# Patient Record
Sex: Female | Born: 1945 | Race: White | Hispanic: No | Smoking: Former smoker
Health system: Southern US, Community
[De-identification: ages and names within clinical notes are randomized; demographics above are authoritative.]

## PROBLEM LIST (undated history)

## (undated) DIAGNOSIS — I639 Cerebral infarction, unspecified: Secondary | ICD-10-CM

## (undated) DIAGNOSIS — L659 Nonscarring hair loss, unspecified: Secondary | ICD-10-CM

## (undated) DIAGNOSIS — H9190 Unspecified hearing loss, unspecified ear: Secondary | ICD-10-CM

## (undated) DIAGNOSIS — J309 Allergic rhinitis, unspecified: Secondary | ICD-10-CM

## (undated) DIAGNOSIS — R3129 Other microscopic hematuria: Secondary | ICD-10-CM

## (undated) DIAGNOSIS — H269 Unspecified cataract: Secondary | ICD-10-CM

## (undated) DIAGNOSIS — C801 Malignant (primary) neoplasm, unspecified: Secondary | ICD-10-CM

## (undated) DIAGNOSIS — E785 Hyperlipidemia, unspecified: Secondary | ICD-10-CM

## (undated) DIAGNOSIS — F419 Anxiety disorder, unspecified: Secondary | ICD-10-CM

## (undated) DIAGNOSIS — R319 Hematuria, unspecified: Secondary | ICD-10-CM

## (undated) DIAGNOSIS — I10 Essential (primary) hypertension: Secondary | ICD-10-CM

## (undated) DIAGNOSIS — R000 Tachycardia, unspecified: Secondary | ICD-10-CM

## (undated) DIAGNOSIS — J189 Pneumonia, unspecified organism: Secondary | ICD-10-CM

## (undated) HISTORY — DX: Other microscopic hematuria: R31.29

## (undated) HISTORY — PX: COLONOSCOPY: SHX174

## (undated) HISTORY — DX: Hematuria, unspecified: R31.9

## (undated) HISTORY — PX: TONSILLECTOMY: SUR1361

## (undated) HISTORY — DX: Tachycardia, unspecified: R00.0

## (undated) HISTORY — PX: MOHS SURGERY: SUR867

## (undated) HISTORY — DX: Pneumonia, unspecified organism: J18.9

## (undated) HISTORY — DX: Essential (primary) hypertension: I10

## (undated) HISTORY — DX: Hyperlipidemia, unspecified: E78.5

## (undated) HISTORY — PX: STRABISMUS SURGERY: SHX218

## (undated) HISTORY — DX: Allergic rhinitis, unspecified: J30.9

## (undated) HISTORY — DX: Nonscarring hair loss, unspecified: L65.9

---

## 2009-07-18 ENCOUNTER — Ambulatory Visit: Payer: Self-pay | Admitting: Internal Medicine

## 2011-07-28 ENCOUNTER — Ambulatory Visit: Payer: Self-pay | Admitting: Internal Medicine

## 2014-03-13 ENCOUNTER — Emergency Department: Payer: Self-pay | Admitting: Emergency Medicine

## 2014-03-13 LAB — CBC
HCT: 41.2 % (ref 35.0–47.0)
HGB: 14.4 g/dL (ref 12.0–16.0)
MCH: 32 pg (ref 26.0–34.0)
MCHC: 34.9 g/dL (ref 32.0–36.0)
MCV: 92 fL (ref 80–100)
Platelet: 323 10*3/uL (ref 150–440)
RBC: 4.49 10*6/uL (ref 3.80–5.20)
RDW: 12.6 % (ref 11.5–14.5)
WBC: 10.9 10*3/uL (ref 3.6–11.0)

## 2014-03-13 LAB — BASIC METABOLIC PANEL
ANION GAP: 3 — AB (ref 7–16)
BUN: 17 mg/dL (ref 7–18)
CHLORIDE: 98 mmol/L (ref 98–107)
CO2: 32 mmol/L (ref 21–32)
CREATININE: 0.62 mg/dL (ref 0.60–1.30)
Calcium, Total: 9.1 mg/dL (ref 8.5–10.1)
EGFR (African American): >60
Glucose: 99 mg/dL (ref 65–99)
OSMOLALITY: 268 (ref 275–301)
Potassium: 3.2 mmol/L — ABNORMAL LOW (ref 3.5–5.1)
Sodium: 133 mmol/L — ABNORMAL LOW (ref 136–145)

## 2014-03-16 ENCOUNTER — Ambulatory Visit (INDEPENDENT_AMBULATORY_CARE_PROVIDER_SITE_OTHER): Payer: Medicare Other | Admitting: Cardiovascular Disease

## 2014-03-16 ENCOUNTER — Encounter (INDEPENDENT_AMBULATORY_CARE_PROVIDER_SITE_OTHER): Payer: Self-pay

## 2014-03-16 ENCOUNTER — Encounter: Payer: Self-pay | Admitting: Cardiovascular Disease

## 2014-03-16 VITALS — BP 148/102 | HR 97 | Ht 65.5 in | Wt 135.8 lb

## 2014-03-16 DIAGNOSIS — F4320 Adjustment disorder, unspecified: Secondary | ICD-10-CM

## 2014-03-16 DIAGNOSIS — F411 Generalized anxiety disorder: Secondary | ICD-10-CM

## 2014-03-16 DIAGNOSIS — R0602 Shortness of breath: Secondary | ICD-10-CM

## 2014-03-16 DIAGNOSIS — I10 Essential (primary) hypertension: Secondary | ICD-10-CM | POA: Insufficient documentation

## 2014-03-16 DIAGNOSIS — F419 Anxiety disorder, unspecified: Secondary | ICD-10-CM | POA: Insufficient documentation

## 2014-03-16 DIAGNOSIS — R9431 Abnormal electrocardiogram [ECG] [EKG]: Secondary | ICD-10-CM

## 2014-03-16 MED ORDER — AMLODIPINE BESYLATE 10 MG PO TABS: 10.0000 mg | ORAL_TABLET | Freq: Every day | ORAL | Status: AC

## 2014-03-16 NOTE — Assessment & Plan Note (Signed)
I known the patient for some time having taking care of her husband. She does have underlying anxiety. Loss of her husband seems to have made this worse. Tried to reassure her through this difficult time.

## 2014-03-16 NOTE — Assessment & Plan Note (Addendum)
Etiology of her symptoms is unclear though will try to exclude cardiac etiology with echo stress test. She feels that she can treadmill. If stress test is normal, would recommend routine exercise for conditioning. She does have a long prior smoking history and unable to exclude mild COPD.

## 2014-03-16 NOTE — Progress Notes (Signed)
Patient ID: Morgan Lara, female    DOB: [birth year], 68 y.o.   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   She was evaluated by her primary care physician 03/01/2014 .She was told there that she had an abnormal EKG. She was told that she might have had an old heart attack  referred here for further evaluation.  She reports that it has been a very difficult several months since her husband passed at the end of 2013/04/02. Blood pressure has been labile, heart rate labile. Difficulty sleeping. She reports that recently she developed acute onset of blurry vision. She felt like she had water in her eyes. She became very anxious. She presented to the emergency room for further evaluation. CT scan of the head was performed for vision changes and this was essentially normal. Creatinine 0.62, potassium 3.2, BUN 17, glucose 99 EKG showed normal sinus rhythm with rate 90 beats per minute, poor R-wave through the anterior precordial leads, left anterior fascicular block  She is concerned about the low potassium that was seen while she was in the emergency room 03/13/2014. Potassium was 3.2. She was given supplemental potassium by the ER doctor. Since her discharge, she has been feeling better. She is concerned about her abnormal EKG.  EKG today shows normal sinus rhythm with rate 97 beats per minute with poor R-wave progression through the anterior precordial leads, left anterior fascicular block (though unable to definitively exclude old inferior MI)   Outpatient Encounter Prescriptions as of 03/16/2014  Medication Sig  . fluticasone (FLONASE) 50 MCG/ACT nasal spray Place  2 sprays into both nostrils daily.  . pravastatin (PRAVACHOL) 40 MG tablet Take 40 mg by mouth daily.  . quinapril (ACCUPRIL) 40 MG tablet Take 40 mg by mouth at bedtime.  . zoster vaccine live, PF, (ZOSTAVAX) 84696 UNT/0.65ML injection Inject 0.65 mLs into the skin once.  .  hydrochlorothiazide (HYDRODIURIL) 25 MG tablet Take 25 mg by mouth daily.     Review of Systems  Constitutional: Negative.   HENT: Negative.   Eyes: Negative.   Respiratory: Negative.   Cardiovascular: Negative.   Gastrointestinal: Negative.   Endocrine: Negative.   Musculoskeletal: Negative.   Skin: Negative.   Allergic/Immunologic: Negative.   Neurological: Negative.   Hematological: Negative.   Psychiatric/Behavioral: The patient is nervous/anxious.   All other systems reviewed and are negative.    BP 148/102  Pulse 97  Ht 5' 5.5" (1.664 m)  Wt 135 lb 12 oz (61.576 kg)  BMI 22.24 kg/m2  Physical Exam  Nursing note and vitals reviewed. Constitutional: She is oriented to person, place, and time. She appears well-developed and well-nourished.  HENT:  Head: Normocephalic.  Nose: Nose normal.  Mouth/Throat: Oropharynx is clear and moist.  Eyes: Conjunctivae are normal. Pupils are equal, round, and reactive to light.  Neck: Normal range of motion. Neck supple. No JVD present.  Cardiovascular: Normal rate, regular rhythm, S1 normal, S2 normal, normal heart sounds and intact distal pulses.  Exam reveals no gallop and no friction rub.   No murmur heard. Pulmonary/Chest: Effort normal and breath sounds normal. No respiratory distress. She has no wheezes. She  has no rales. She exhibits no tenderness.  Abdominal: Soft. Bowel sounds are normal. She exhibits no distension. There is no tenderness.  Musculoskeletal: Normal range of motion. She exhibits no edema and no tenderness.  Lymphadenopathy:    She has no cervical adenopathy.  Neurological: She is alert and oriented to person, place, and time.  Coordination normal.  Skin: Skin is warm and dry. No rash noted. No erythema.  Psychiatric: She has a normal mood and affect. Her behavior is normal. Judgment and thought content normal.    Assessment and Plan

## 2014-03-16 NOTE — Assessment & Plan Note (Signed)
We will hold the HCTZ given her low potassium. She does not want to take a potassium supplement. We will start amlodipine 10 mg daily

## 2014-03-16 NOTE — Patient Instructions (Addendum)
You are doing well. Please hold the HCTZ Start amlodipine one a day for blood pressure  We will order an echocardiogram/echo stress echo for shortness of breath, abnormal ekg To be done on the same day  Please call us if you have new issues that need to be addressed before your next appt.

## 2014-03-16 NOTE — Assessment & Plan Note (Signed)
EKG has poor R-wave progression through the anterior precordial leads, less likely anterior MI. 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 Stress Echocardiogram has been ordered to evaluate cardiac function and rule out ischemia she also has symptoms of shortness of breath with exertion.

## 2014-03-16 NOTE — Assessment & Plan Note (Signed)
She has recently lost her husband and is having a difficult time adjusting

## 2014-03-21 ENCOUNTER — Telehealth: Payer: Self-pay

## 2014-03-21 NOTE — Telephone Encounter (Signed)
Spoke w/ pt.  She asks that I leave letter at the front desk for her to pick up tomorrow.

## 2014-03-21 NOTE — Telephone Encounter (Signed)
Pt called and states she needs a letter stating that she needs a letter stating she needs an echocardiogram. States she is filing for a pension from her husband who has passed.

## 2014-03-27 ENCOUNTER — Telehealth: Payer: Self-pay

## 2014-03-27 NOTE — Telephone Encounter (Signed)
Would try for a few weeks on a 1/2 dose before quiting We can call in HCTZ and she can take a few doses (one a day for a few days) to see if this helps her leg swelling If she is at the beach, watch out on salts and fluids Also she can have leg swelling after a car ride. Would put legs up, tight socks for a few days

## 2014-03-27 NOTE — Telephone Encounter (Signed)
Pt called and has a question regarding the new medication she is taking. Pt states she is having some "puffiness". Please call.

## 2014-03-27 NOTE — Telephone Encounter (Signed)
Spoke w/ pt.  She reports that since starting amlodipine, she has had increased swelling in her feet, ankles, and around her eyes. She took her dose this am.  Pt currently at [facility] w/ her daughter.  She has contact info for local pharmacy in case Dr. Rockey Situ would like another med called it.  Pt does not have any more HCTZ, as she ran out and has not gotten any more refills since it was d/c'd.  Pt does not have BP monitor. Please advise.  Thank you.

## 2014-03-28 ENCOUNTER — Telehealth: Payer: Self-pay

## 2014-03-28 MED ORDER — HYDROCHLOROTHIAZIDE 25 MG PO TABS: 25.0000 mg | ORAL_TABLET | Freq: Every day | ORAL | Status: DC

## 2014-03-28 NOTE — Telephone Encounter (Signed)
Left message for pt to call back  °

## 2014-03-28 NOTE — Telephone Encounter (Signed)
Pt states she needs to talk with you regarding her medication. Please call.

## 2014-03-28 NOTE — Telephone Encounter (Signed)
Spoke w/ pt.  She reports that her swelling has decreased a bit and is not as bad as yesterday, but her legs still feel a bit tight. She is agreeable to trying 1/2 amlodipine for a few weeks and will call to let me know how she is doing.  Pt does not want to take HCTZ right now, as she is at the beach and would like so see if the swelling will decrease w/o this.  Discussed the reason for swelling after a car ride.  She states that she will wear tight socks and try to elevate her legs as much as possible today. Pt to call tomorrow if she changes her mind.

## 2014-03-28 NOTE — Telephone Encounter (Signed)
Spoke w/ pt. She reports that she is still having swelling that worsened a few hours after taking 1/2 of her amlodipine. She asks that we send in an rx for HCTZ to pharmacy in [place] for her to pick up while she's at the beach.  She states that she is going to the zoo tomorrow and that she does not want to take the amlodipine before doing that much walking.  Advised her that if she would like to hold the amlodipine and continue her HCTZ until she is back at home.  She is agreeable to this and will try to obtain a BP monitor in order to keep track of this.  Pt to call w/ further questions or concerns.

## 2014-03-31 ENCOUNTER — Other Ambulatory Visit: Payer: Medicare Other

## 2014-04-06 ENCOUNTER — Telehealth: Payer: Self-pay

## 2014-04-06 NOTE — Telephone Encounter (Signed)
Pt called to confirm her appt for stress echo and asked for instructions before the test.  Reviewed procedure w/ pt.   Pt is very anxious about test, as she is concerned that something will happen to her during the test, she will not be able to wear a shirt and is concerned about her privacy. Reassured pt and addressed each of her concerns.  She is appreciative and will keep appt for stress echo.

## 2014-04-07 ENCOUNTER — Other Ambulatory Visit: Payer: Medicare Other

## 2014-04-21 ENCOUNTER — Other Ambulatory Visit: Payer: Medicare Other

## 2014-07-06 ENCOUNTER — Other Ambulatory Visit: Payer: Medicare Other

## 2014-07-21 ENCOUNTER — Other Ambulatory Visit: Payer: Medicare Other

## 2014-08-09 ENCOUNTER — Telehealth: Payer: Self-pay

## 2014-08-09 NOTE — Telephone Encounter (Signed)
Pt called and wants to some advise. States she has been scheduled for several stress echos, and we cancelled, and then she had cancelled, not sure if she should re-schedule this before she sees Dr. Rockey Situ. States she has had a lot of congestion, has called her PCP and cannot see her until next wed. Not sure if this is heart related or not. Please call.

## 2014-08-09 NOTE — Telephone Encounter (Signed)
Would probably hold off on any testing until her congestion is better. Would see her primary care first for possible allergies or cause of her chest congestion. We can certainly move her appointment up if needed

## 2014-08-09 NOTE — Telephone Encounter (Signed)
Spoke w/ pt.  She reports that she has quite a bit of congestion, states that anything she coughs up is clear.  States that her husband "coughed stuff up" and she is concerned that it is r/t her heart. Reports that her sx worsen when she has been outside for a while and she is unsure if they are r/t allergies.  States that her anxiety "sometimes gets the best of her".  She would like to know if Dr. Rockey Situ would like for her to have her stress echo before her appt on 09/12/14. Reports that she has quite a bit of anxiety regarding this and has not had any more episodes of SOB. Please advise.  Thank you.

## 2014-08-10 NOTE — Telephone Encounter (Signed)
Spoke w/ pt.  Advised her of Dr. Donivan Scull recommendation.  She verbalizes understanding and will keep her appt w/ Dr. Rockey Situ on 09/12/14. Asked her to call back w/ any questions or concerns.

## 2014-09-12 ENCOUNTER — Ambulatory Visit: Payer: Medicare Other | Admitting: Cardiovascular Disease

## 2014-09-13 ENCOUNTER — Encounter: Payer: Self-pay | Admitting: Cardiovascular Disease

## 2014-09-13 ENCOUNTER — Ambulatory Visit (INDEPENDENT_AMBULATORY_CARE_PROVIDER_SITE_OTHER): Payer: Medicare Other | Admitting: Cardiovascular Disease

## 2014-09-13 VITALS — BP 132/85 | HR 79 | Ht 65.0 in | Wt 137.2 lb

## 2014-09-13 DIAGNOSIS — R0602 Shortness of breath: Secondary | ICD-10-CM

## 2014-09-13 DIAGNOSIS — F411 Generalized anxiety disorder: Secondary | ICD-10-CM

## 2014-09-13 DIAGNOSIS — I10 Essential (primary) hypertension: Secondary | ICD-10-CM

## 2014-09-13 DIAGNOSIS — R0789 Other chest pain: Secondary | ICD-10-CM

## 2014-09-13 DIAGNOSIS — F419 Anxiety disorder, unspecified: Secondary | ICD-10-CM

## 2014-09-13 DIAGNOSIS — R9431 Abnormal electrocardiogram [ECG] [EKG]: Secondary | ICD-10-CM

## 2014-09-13 NOTE — Assessment & Plan Note (Signed)
She continues to have mild shortness of breath, most commonly at rest, less with exertion. Less likely ischemia. She does have a significant smoking history and secondhand smoke exposure. Unable to exclude COPD

## 2014-09-13 NOTE — Patient Instructions (Addendum)
You are doing well. No medication changes were made.  We will schedule an echocardiogram  Allergry pill: cetirazine (generic for zyrtec) Try a 1/2 dose first  Please call us if you have new issues that need to be addressed before your next appt.  Your physician wants you to follow-up in: 12 months.  You will receive a reminder letter in the mail two months in advance. If you don't receive a letter, please call our office to schedule the follow-up appointment.

## 2014-09-13 NOTE — Assessment & Plan Note (Signed)
She was very stressed about doing a stress test. We have ordered a echocardiogram for murmur, shortness of breath.

## 2014-09-13 NOTE — Assessment & Plan Note (Signed)
She does have underlying anxiety. I have tried to reassure her about her cardiac issues

## 2014-09-13 NOTE — Assessment & Plan Note (Signed)
Blood pressure is well controlled on today's visit. No changes made to the medications. 

## 2014-09-13 NOTE — Progress Notes (Signed)
Patient ID: Morgan Lara, female    DOB: 05-04-1946, 68 y.o.   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   She was evaluated by her primary care physician 03/01/2014 .She was told there that she had an abnormal EKG. She was told that she might have had an old heart attack  referred here for further evaluation. Attempts to schedule a stress echo have been unsuccessful due to various issues, most recently she had chest congestion and canceled her test.  In followup today, she reports that she is active, is able to play with her grandchildren with no symptoms of shortness of breath. She does have symptoms at rest including chest tightness, shortness of breath, coughing. She takes her HCTZ periodically for leg edema or bloating. She is concerned about various issues.  She did get significant secondhand smoke. She also smoked for a long period of time. She has a chronic low-grade cough  EKG today shows normal sinus rhythm with rate 79 beats per minute, poor R-wave progression through the anterior precordial leads, left axis deviation/left anterior fascicular block No change from prior EKG No prior echocardiogram available   Outpatient Encounter Prescriptions as of 09/13/2014  Medication Sig  . amLODipine (NORVASC) 10 MG tablet Take 1 tablet (10 mg total) by mouth daily.  . fluticasone (FLONASE) 50 MCG/ACT nasal spray Place 2 sprays into both nostrils daily.  . hydrochlorothiazide (HYDRODIURIL) 25 MG tablet Take 1 tablet (25 mg total) by mouth daily.  . pravastatin (PRAVACHOL) 40 MG tablet Take 40 mg by mouth daily.  . quinapril (ACCUPRIL) 40 MG tablet Take  40 mg by mouth daily.   Marland Kitchen zoster vaccine live, PF, (ZOSTAVAX) 77939 UNT/0.65ML injection Inject 0.65 mLs into the skin once.     Review of Systems  Constitutional: Negative.   HENT: Negative.   Eyes: Negative.   Respiratory: Positive for chest tightness and shortness of breath.   Cardiovascular: Positive for chest pain.  Gastrointestinal: Negative.   Endocrine: Negative.   Musculoskeletal: Negative.   Skin: Negative.   Allergic/Immunologic: Negative.   Neurological: Negative.   Hematological: Negative.   Psychiatric/Behavioral: The patient is nervous/anxious.   All other systems reviewed and are negative.   BP 132/85  Pulse 79  Ht 5\' 5"  (1.651 m)  Wt 137 lb 4 oz (62.256 kg)  BMI 22.84 kg/m2  Physical Exam  Nursing note and vitals reviewed. Constitutional: She is oriented to person, place, and time. She appears well-developed and well-nourished.  HENT:  Head: Normocephalic.  Nose: Nose normal.  Mouth/Throat: Oropharynx is clear and moist.  Eyes: Conjunctivae are normal. Pupils are equal, round, and reactive to light.  Neck: Normal range of motion. Neck supple. No JVD present.  Cardiovascular: Normal rate, regular rhythm, S1 normal, S2 normal, normal heart sounds and intact distal pulses.  Exam reveals no gallop and no friction rub.   No murmur heard. Pulmonary/Chest: Effort normal and breath sounds normal. No respiratory distress. She has no wheezes. She has no rales. She exhibits no tenderness.  Abdominal: Soft. Bowel sounds are normal. She exhibits no distension. There is no tenderness.  Musculoskeletal: Normal range of motion. She  exhibits no edema and no tenderness.  Lymphadenopathy:    She has no cervical adenopathy.  Neurological: She is alert and oriented to person, place, and time. Coordination normal.  Skin: Skin is warm and dry. No rash noted. No erythema.  Psychiatric: She has a normal mood and affect. Her behavior is normal. Judgment and thought content  normal.    Assessment and Plan

## 2014-09-26 ENCOUNTER — Other Ambulatory Visit: Payer: Medicare Other

## 2014-10-05 ENCOUNTER — Other Ambulatory Visit: Payer: Medicare Other

## 2014-10-05 ENCOUNTER — Other Ambulatory Visit: Payer: Self-pay

## 2014-10-05 DIAGNOSIS — R0602 Shortness of breath: Secondary | ICD-10-CM

## 2014-10-05 DIAGNOSIS — R0789 Other chest pain: Secondary | ICD-10-CM

## 2014-10-11 ENCOUNTER — Telehealth: Payer: Self-pay | Admitting: Cardiovascular Disease

## 2014-10-11 NOTE — Telephone Encounter (Signed)
Please see result note 

## 2014-10-11 NOTE — Telephone Encounter (Signed)
Pt is calling back, got a call from [place], pt asked to call cell phone for she is not at home.

## 2017-10-26 ENCOUNTER — Other Ambulatory Visit: Payer: Self-pay | Admitting: Internal Medicine

## 2017-10-26 DIAGNOSIS — R310 Gross hematuria: Secondary | ICD-10-CM

## 2017-11-02 ENCOUNTER — Ambulatory Visit
Admission: RE | Admit: 2017-11-02 | Discharge: 2017-11-02 | Disposition: A | Discharge status: Home / Self Care | Payer: Medicare Other | Source: Ambulatory Visit | Attending: Internal Medicine | Admitting: Internal Medicine

## 2017-11-02 DIAGNOSIS — R310 Gross hematuria: Secondary | ICD-10-CM | POA: Insufficient documentation

## 2017-11-02 DIAGNOSIS — I700 Atherosclerosis of aorta: Secondary | ICD-10-CM | POA: Diagnosis not present

## 2017-11-25 ENCOUNTER — Other Ambulatory Visit: Payer: Self-pay | Admitting: Internal Medicine

## 2017-11-25 DIAGNOSIS — N951 Menopausal and female climacteric states: Secondary | ICD-10-CM

## 2017-11-25 DIAGNOSIS — Z1239 Encounter for other screening for malignant neoplasm of breast: Secondary | ICD-10-CM

## 2018-01-07 ENCOUNTER — Other Ambulatory Visit: Payer: Medicare Other

## 2018-10-21 IMAGING — CT CT RENAL STONE PROTOCOL
1 of 2 series · 15 of 32 positions shown, 19 images · non-contrast
Comparison: None.

CLINICAL DATA: Gross hematuria, chronic back pain.

EXAM:
CT ABDOMEN AND PELVIS WITHOUT CONTRAST
TECHNIQUE: Multidetector CT imaging of the abdomen and pelvis was performed
following the standard protocol without IV contrast.

[Series 2: axial st · axial · 0.78mm/px · z∈[-910,-520]mm · 15 of 86 slices shown, 19 images]
[im 4/86  soft-tissue]
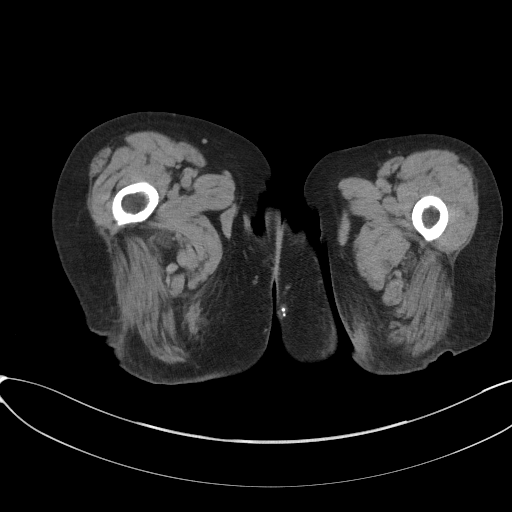
[im 4/86  bone]
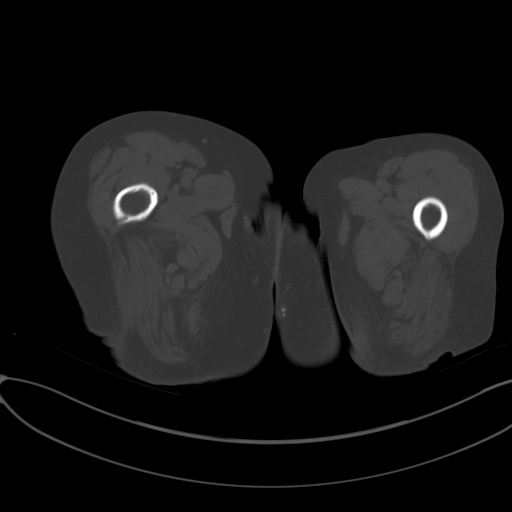
[im 11/86  soft-tissue]
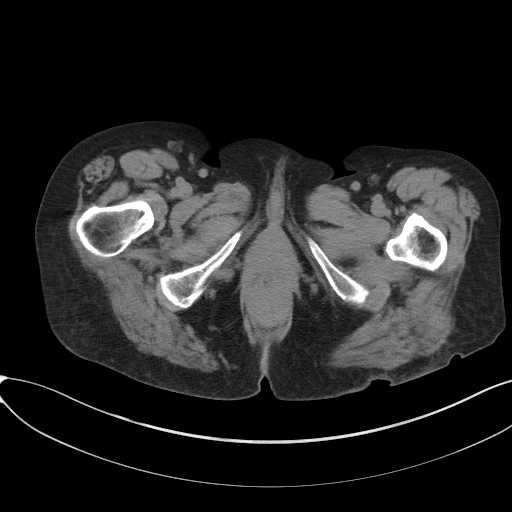
[im 18/86  soft-tissue]
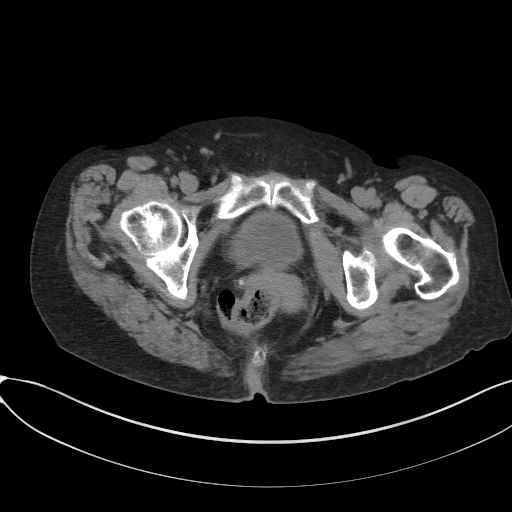
[im 25/86  soft-tissue]
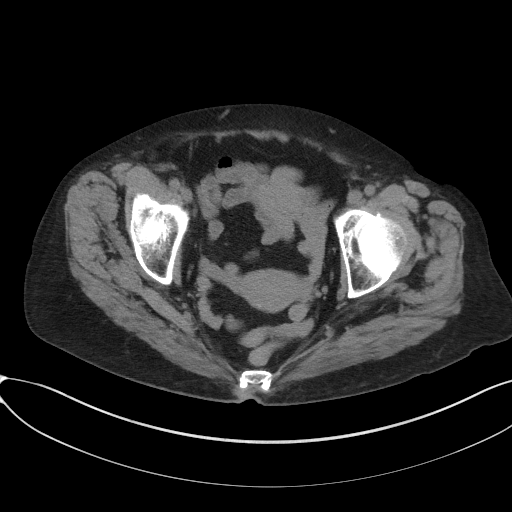
[im 29/86  soft-tissue]
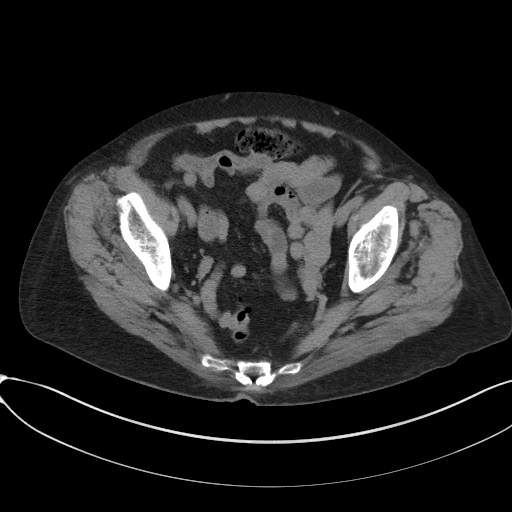
[im 36/86  soft-tissue]
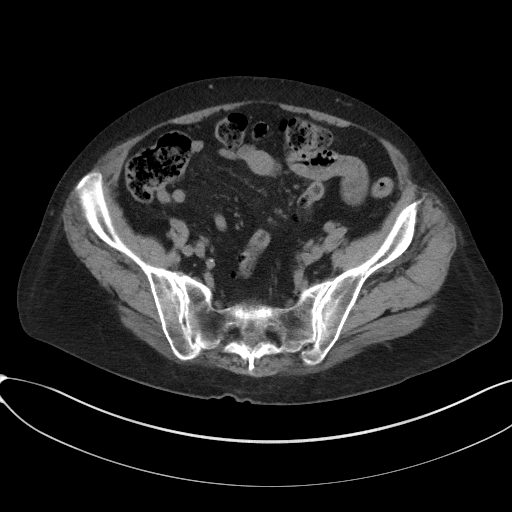
[im 43/86  soft-tissue]
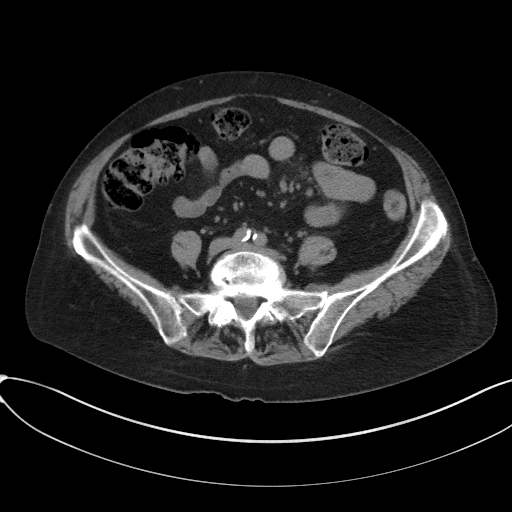
[im 50/86  soft-tissue]
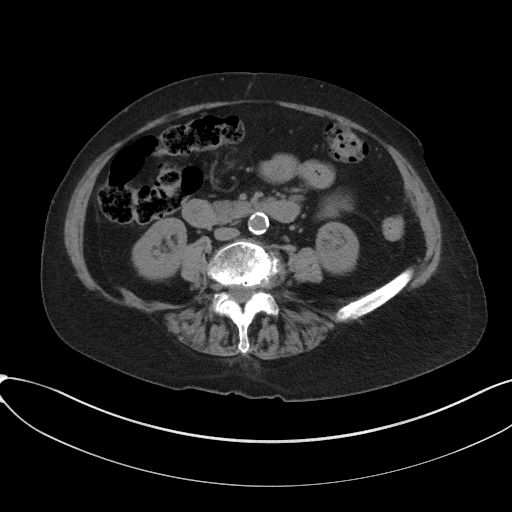
[im 57/86  soft-tissue]
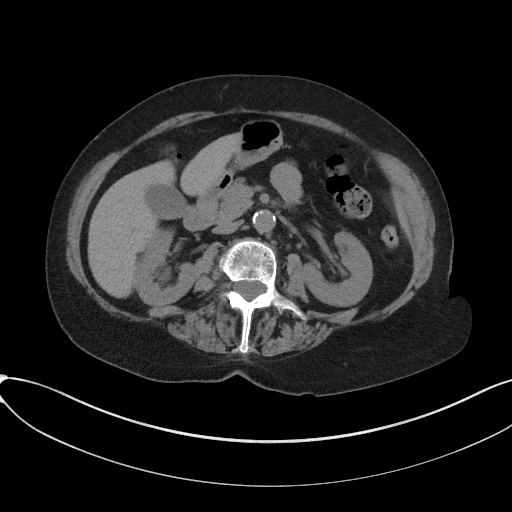
[im 57/86  bone]
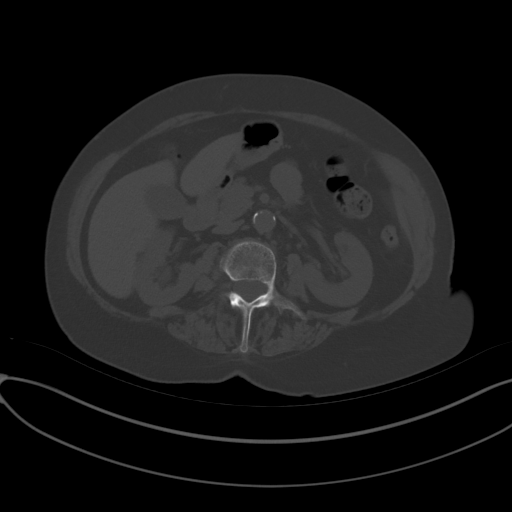
[im 61/86  soft-tissue]
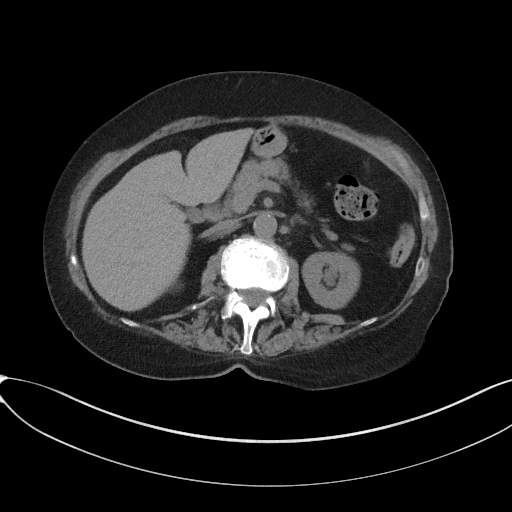
[im 68/86  soft-tissue]
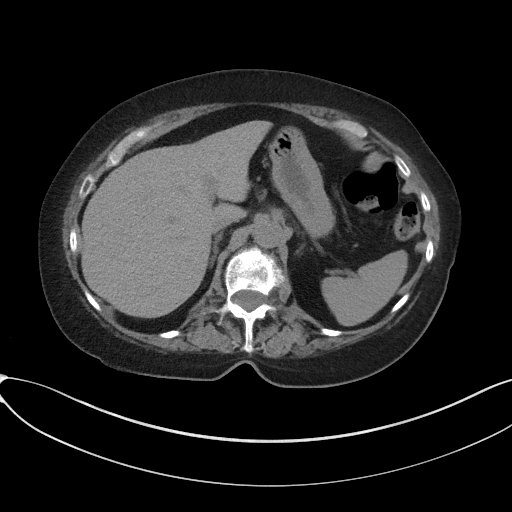
[im 71/86  lung]
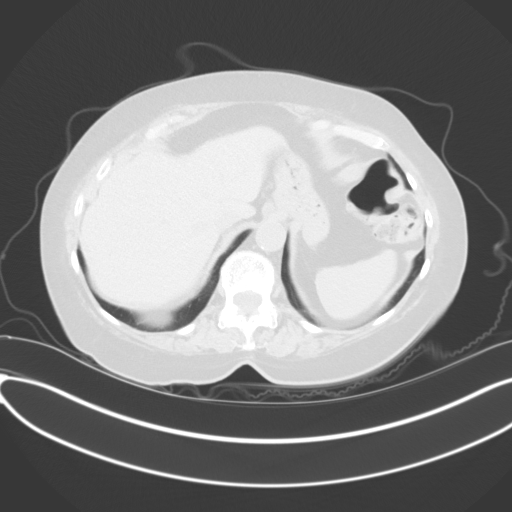
[im 75/86  soft-tissue]
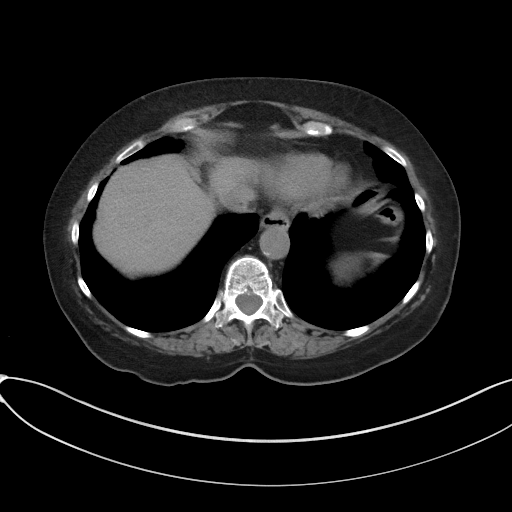
[im 75/86  lung]
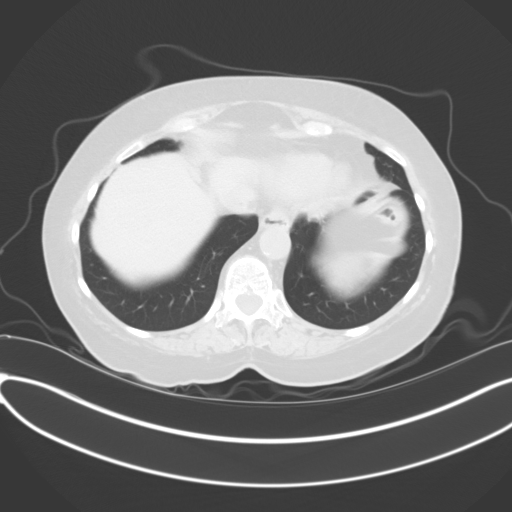
[im 78/86  lung]
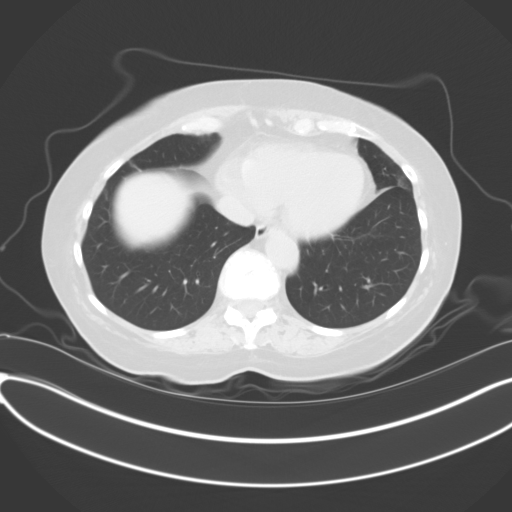
[im 82/86  soft-tissue]
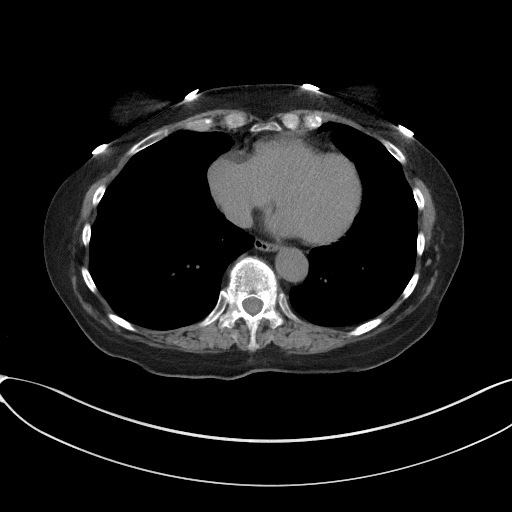
[im 82/86  lung]
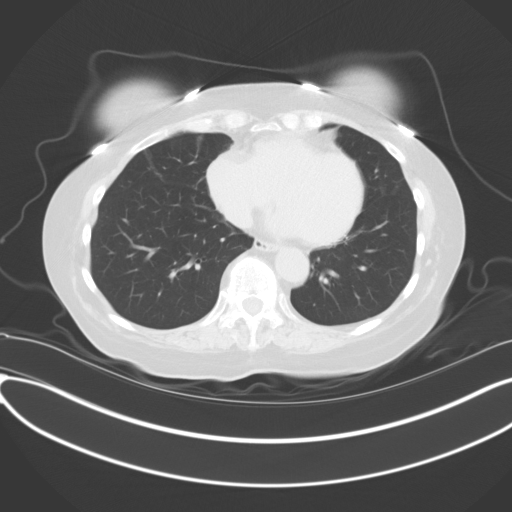

[15 of 32 positions shown; findings below may reference images not displayed]

FINDINGS: Lower chest: No acute abnormality.

Hepatobiliary: No focal liver abnormality is seen. No gallstones,
gallbladder wall thickening, or biliary dilatation.

Pancreas: Unremarkable. No pancreatic ductal dilatation or
surrounding inflammatory changes.

Spleen: Normal in size without focal abnormality.

Adrenals/Urinary Tract: Adrenal glands are unremarkable. Kidneys are
normal, without renal calculi, focal lesion, or hydronephrosis.
Bladder is unremarkable.

Stomach/Bowel: Stomach is within normal limits. Appendix appears
normal. No evidence of bowel wall thickening, distention, or
inflammatory changes.

Vascular/Lymphatic: Aortic atherosclerosis. No enlarged abdominal or
pelvic lymph nodes.

Reproductive: Uterus and bilateral adnexa are unremarkable.

Other: No abdominal wall hernia or abnormality. No abdominopelvic
ascites.

Musculoskeletal: Severe multilevel degenerative disc disease is
noted in the lumbar spine. No acute abnormality is noted.
IMPRESSION: Aortic atherosclerosis.

No hydronephrosis or renal obstruction is noted. No renal or
ureteral calculi are noted.

## 2019-01-04 ENCOUNTER — Other Ambulatory Visit: Payer: Self-pay | Admitting: Internal Medicine

## 2019-01-04 DIAGNOSIS — Z1239 Encounter for other screening for malignant neoplasm of breast: Secondary | ICD-10-CM

## 2019-01-04 DIAGNOSIS — Z1231 Encounter for screening mammogram for malignant neoplasm of breast: Secondary | ICD-10-CM

## 2019-06-01 ENCOUNTER — Ambulatory Visit: Admit: 2019-06-01 | Payer: Medicare Other | Admitting: Ophthalmology

## 2019-08-02 ENCOUNTER — Ambulatory Visit: Admit: 2019-08-02 | Payer: Medicare Other | Admitting: Ophthalmology

## 2019-08-02 SURGERY — PHACOEMULSIFICATION, CATARACT, WITH IOL INSERTION
Anesthesia: Choice | Laterality: Left

## 2019-10-24 ENCOUNTER — Encounter: Payer: Self-pay | Admitting: *Deleted

## 2019-10-25 ENCOUNTER — Other Ambulatory Visit: Payer: Self-pay

## 2019-10-25 ENCOUNTER — Other Ambulatory Visit
Admission: RE | Admit: 2019-10-25 | Discharge: 2019-10-25 | Disposition: A | Discharge status: Home / Self Care | Payer: Medicare Other | Source: Ambulatory Visit | Attending: Ophthalmology | Admitting: Ophthalmology

## 2019-10-25 DIAGNOSIS — Z01812 Encounter for preprocedural laboratory examination: Secondary | ICD-10-CM | POA: Diagnosis present

## 2019-10-25 DIAGNOSIS — Z20828 Contact with and (suspected) exposure to other viral communicable diseases: Secondary | ICD-10-CM | POA: Insufficient documentation

## 2019-10-25 LAB — SARS CORONAVIRUS 2 (TAT 6-24 HRS): SARS Coronavirus 2: NEGATIVE

## 2019-10-28 ENCOUNTER — Ambulatory Visit
Admission: RE | Admit: 2019-10-28 | Discharge: 2019-10-28 | Disposition: A | Discharge status: Home / Self Care | Payer: Medicare Other | Attending: Ophthalmology | Admitting: Ophthalmology

## 2019-10-28 ENCOUNTER — Encounter
Admission: RE | Disposition: A | Discharge status: Home / Self Care | Payer: Self-pay | Source: Home / Self Care | Attending: Ophthalmology

## 2019-10-28 ENCOUNTER — Encounter: Payer: Self-pay | Admitting: *Deleted

## 2019-10-28 ENCOUNTER — Ambulatory Visit: Payer: Medicare Other | Admitting: Certified Registered Nurse Anesthetist

## 2019-10-28 DIAGNOSIS — H2512 Age-related nuclear cataract, left eye: Secondary | ICD-10-CM | POA: Insufficient documentation

## 2019-10-28 DIAGNOSIS — H9190 Unspecified hearing loss, unspecified ear: Secondary | ICD-10-CM | POA: Insufficient documentation

## 2019-10-28 DIAGNOSIS — I10 Essential (primary) hypertension: Secondary | ICD-10-CM | POA: Diagnosis not present

## 2019-10-28 DIAGNOSIS — Z87891 Personal history of nicotine dependence: Secondary | ICD-10-CM | POA: Insufficient documentation

## 2019-10-28 DIAGNOSIS — Z79899 Other long term (current) drug therapy: Secondary | ICD-10-CM | POA: Diagnosis not present

## 2019-10-28 DIAGNOSIS — Z872 Personal history of diseases of the skin and subcutaneous tissue: Secondary | ICD-10-CM | POA: Insufficient documentation

## 2019-10-28 HISTORY — DX: Unspecified cataract: H26.9

## 2019-10-28 HISTORY — DX: Unspecified hearing loss, unspecified ear: H91.90

## 2019-10-28 HISTORY — PX: CATARACT EXTRACTION W/PHACO: SHX586

## 2019-10-28 HISTORY — DX: Cerebral infarction, unspecified: I63.9

## 2019-10-28 HISTORY — DX: Malignant (primary) neoplasm, unspecified: C80.1

## 2019-10-28 SURGERY — PHACOEMULSIFICATION, CATARACT, WITH IOL INSERTION
Anesthesia: Monitor Anesthesia Care | Site: Eye | Laterality: Left

## 2019-10-28 MED ORDER — MOXIFLOXACIN HCL 0.5 % OP SOLN
1.0000 [drp] | OPHTHALMIC | Status: DC
  Administered 2019-10-28 (×4): 1 [drp] via OPHTHALMIC

## 2019-10-28 MED ORDER — MOXIFLOXACIN HCL 0.5 % OP SOLN
OPHTHALMIC | Status: AC
  Administered 2019-10-28: 1 [drp] via OPHTHALMIC
  Filled 2019-10-28: qty 3

## 2019-10-28 MED ORDER — NA HYALUR & NA CHOND-NA HYALUR 0.4-0.35 ML IO KIT
PACK | INTRAOCULAR | Status: DC | PRN
  Administered 2019-10-28: .35 mL via INTRAOCULAR

## 2019-10-28 MED ORDER — MIDAZOLAM HCL 2 MG/2ML IJ SOLN
INTRAMUSCULAR | Status: DC | PRN
  Administered 2019-10-28 (×2): 1 mg via INTRAVENOUS

## 2019-10-28 MED ORDER — EPINEPHRINE PF 1 MG/ML IJ SOLN
INTRAOCULAR | Status: DC | PRN
  Administered 2019-10-28: 09:00:00 via OPHTHALMIC

## 2019-10-28 MED ORDER — LIDOCAINE HCL (PF) 4 % IJ SOLN
INTRAOCULAR | Status: DC | PRN
  Administered 2019-10-28: 4 mL via OPHTHALMIC

## 2019-10-28 MED ORDER — ARMC OPHTHALMIC DILATING DROPS
OPHTHALMIC | Status: AC
  Administered 2019-10-28: 1 via OPHTHALMIC
  Filled 2019-10-28: qty 0.5

## 2019-10-28 MED ORDER — TETRACAINE HCL 0.5 % OP SOLN
OPHTHALMIC | Status: AC
  Administered 2019-10-28: 07:00:00 1 [drp] via OPHTHALMIC
  Filled 2019-10-28: qty 4

## 2019-10-28 MED ORDER — CARBACHOL 0.01 % IO SOLN
INTRAOCULAR | Status: DC | PRN
  Administered 2019-10-28: 0.5 mL via INTRAOCULAR

## 2019-10-28 MED ORDER — ARMC OPHTHALMIC DILATING DROPS
1.0000 "application " | OPHTHALMIC | Status: AC
  Administered 2019-10-28 (×3): 1 via OPHTHALMIC

## 2019-10-28 MED ORDER — TETRACAINE HCL 0.5 % OP SOLN
1.0000 [drp] | Freq: Once | OPHTHALMIC | Status: AC
  Administered 2019-10-28: 07:00:00 1 [drp] via OPHTHALMIC

## 2019-10-28 MED ORDER — FENTANYL CITRATE (PF) 100 MCG/2ML IJ SOLN
INTRAMUSCULAR | Status: AC
  Filled 2019-10-28: qty 2

## 2019-10-28 MED ORDER — FENTANYL CITRATE (PF) 100 MCG/2ML IJ SOLN
INTRAMUSCULAR | Status: DC | PRN
  Administered 2019-10-28: 25 ug via INTRAVENOUS

## 2019-10-28 MED ORDER — NEOMYCIN-POLYMYXIN-DEXAMETH 0.1 % OP OINT
TOPICAL_OINTMENT | OPHTHALMIC | Status: DC | PRN
  Administered 2019-10-28: 1 via OPHTHALMIC

## 2019-10-28 MED ORDER — SODIUM CHLORIDE 0.9 % IV SOLN
INTRAVENOUS | Status: DC
  Administered 2019-10-28: 07:00:00 via INTRAVENOUS

## 2019-10-28 MED ORDER — MIDAZOLAM HCL 2 MG/2ML IJ SOLN
INTRAMUSCULAR | Status: AC
  Filled 2019-10-28: qty 2

## 2019-10-28 MED ORDER — MOXIFLOXACIN HCL 0.5 % OP SOLN
OPHTHALMIC | Status: DC | PRN
  Administered 2019-10-28: 0.2 mL via OPHTHALMIC

## 2019-10-28 MED ORDER — POVIDONE-IODINE 5 % OP SOLN
OPHTHALMIC | Status: DC | PRN
  Administered 2019-10-28: 1 via OPHTHALMIC

## 2019-10-28 SURGICAL SUPPLY — 18 items
GLOVE BIO SURGEON STRL SZ8 (GLOVE) ×2 IMPLANT
GLOVE BIOGEL M 6.5 STRL (GLOVE) ×2 IMPLANT
GLOVE SURG LX 7.5 STRW (GLOVE) ×1
GLOVE SURG LX STRL 7.5 STRW (GLOVE) ×1 IMPLANT
GOWN STRL REUS W/ TWL LRG LVL3 (GOWN DISPOSABLE) ×2 IMPLANT
GOWN STRL REUS W/TWL LRG LVL3 (GOWN DISPOSABLE) ×2
LABEL CATARACT MEDS ST (LABEL) ×2 IMPLANT
LENS IOL TECNIS ITEC 22.5 (Intraocular Lens) ×2 IMPLANT
NDL HPO THNWL 1X22GA REG BVL (NEEDLE) ×1 IMPLANT
NEEDLE SAFETY 22GX1 (NEEDLE) ×1
PACK CATARACT (MISCELLANEOUS) ×2 IMPLANT
PACK CATARACT BRASINGTON LX (MISCELLANEOUS) ×2 IMPLANT
PACK EYE AFTER SURG (MISCELLANEOUS) ×2 IMPLANT
SOL BSS BAG (MISCELLANEOUS) ×2
SOLUTION BSS BAG (MISCELLANEOUS) ×1 IMPLANT
SYR 5ML LL (SYRINGE) ×2 IMPLANT
WATER STERILE IRR 250ML POUR (IV SOLUTION) ×2 IMPLANT
WIPE NON LINTING 3.25X3.25 (MISCELLANEOUS) ×2 IMPLANT

## 2019-10-28 NOTE — H&P (Signed)

## 2019-10-28 NOTE — Transfer of Care (Signed)
Immediate Anesthesia Transfer of Care Note  Patient: Morgan Lara  Procedure(s) Performed: CATARACT EXTRACTION PHACO AND INTRAOCULAR LENS PLACEMENT (IOC) LEFT (Left Eye)  Patient Location: PACU  Anesthesia Type:MAC  Level of Consciousness: awake, alert  and oriented  Airway & Oxygen Therapy: Patient Spontanous Breathing  Post-op Assessment: Report given to RN and Post -op Vital signs reviewed and stable  Post vital signs: Reviewed and stable  Last Vitals:  Vitals Value Taken Time  BP 105/67 10/28/19 0857  Temp 36.3 C 10/28/19 0857  Pulse 78 10/28/19 0857  Resp 16 10/28/19 0857  SpO2 97 % 10/28/19 0857    Last Pain:  Vitals:   10/28/19 0857  TempSrc:   PainSc: 0-No pain         Complications: No apparent anesthesia complications

## 2019-10-28 NOTE — Anesthesia Preprocedure Evaluation (Signed)
Anesthesia Evaluation  Patient identified by MRN, date of birth, ID band Patient awake    Reviewed: Allergy & Precautions, H&P , NPO status , Patient's Chart, lab work & pertinent test results, reviewed documented beta blocker date and time   History of Anesthesia Complications Negative for: history of anesthetic complications  Airway Mallampati: I  TM Distance: >3 FB Neck ROM: full    Dental  (+) Dental Advidsory Given, Upper Dentures, Lower Dentures, Edentulous Upper, Edentulous Lower   Pulmonary neg pulmonary ROS, former smoker,    Pulmonary exam normal        Cardiovascular Exercise Tolerance: Good hypertension, (-) angina(-) Past MI and (-) Cardiac Stents Normal cardiovascular exam(-) dysrhythmias (-) Valvular Problems/Murmurs     Neuro/Psych neg Seizures PSYCHIATRIC DISORDERS Anxiety TIA   GI/Hepatic negative GI ROS, Neg liver ROS,   Endo/Other  negative endocrine ROS  Renal/GU negative Renal ROS  negative genitourinary   Musculoskeletal   Abdominal   Peds  Hematology negative hematology ROS (+)   Anesthesia Other Findings Past Medical History: No date: Allergic rhinitis No date: Cancer [facility])     Comment:  skin No date: Cataract     Comment:  left No date: Hair loss No date: Hematuria No date: HOH (hard of hearing) No date: Hyperlipidemia No date: Hypertension No date: Microscopic hematuria No date: Pneumonia No date: Stroke [facility])     Comment:  ? of TIA No date: Tachycardia, unspecified   Reproductive/Obstetrics negative OB ROS                             Anesthesia Physical Anesthesia Plan  ASA: II  Anesthesia Plan: MAC   Post-op Pain Management:    Induction: Intravenous  PONV Risk Score and Plan: 2 and Ondansetron and Dexamethasone  Airway Management Planned: Natural Airway and Nasal Cannula  Additional Equipment:   Intra-op Plan:   Post-operative Plan:    Informed Consent: I have reviewed the patients History and Physical, chart, labs and discussed the procedure including the risks, benefits and alternatives for the proposed anesthesia with the patient or authorized representative who has indicated his/her understanding and acceptance.     Dental Advisory Given  Plan Discussed with: Anesthesiologist, CRNA and Surgeon  Anesthesia Plan Comments:         Anesthesia Quick Evaluation

## 2019-10-28 NOTE — Discharge Instructions (Addendum)
Eye Surgery Discharge Instructions  Expect mild scratchy sensation or mild soreness. DO NOT RUB YOUR EYE!  The day of surgery:  Minimal physical activity, but bed rest is not required  No reading, computer work, or close hand work  No bending, lifting, or straining.  May watch TV  For 24 hours:  No driving, legal decisions, or alcoholic beverages  Safety precautions  Eat anything you prefer: It is better to start with liquids, then soup then solid foods.   Solar shield eyeglasses should be worn for comfort in the sunlight/patch while sleeping  Resume all regular medications including aspirin or Coumadin if these were discontinued prior to surgery. You may shower, bathe, shave, or wash your hair. Tylenol may be taken for mild discomfort. Follow eye drop instruction sheet as reviewed.  Call your doctor if you experience significant pain, nausea, or vomiting, fever > 101 or other signs of infection. [phone] or [phone] Specific instructions:  Follow-up Information    Leandrew Koyanagi, MD Follow up.   Specialty: Ophthalmology Why: 10-28-19 @ 2:50 pm  Contact information: [address]   [place] [address] [phone]

## 2019-10-28 NOTE — Op Note (Signed)
OPERATIVE NOTE  Lalanya Seyfried [id number] 10/28/2019   PREOPERATIVE DIAGNOSIS:  Nuclear sclerotic cataract left eye. H25.12   POSTOPERATIVE DIAGNOSIS:    Nuclear sclerotic cataract left eye.     PROCEDURE:  Phacoemusification with posterior chamber intraocular lens placement of the left eye  Procedure(s) with comments: CATARACT EXTRACTION PHACO AND INTRAOCULAR LENS PLACEMENT (IOC) LEFT (Left) - [place] 00:37.8 CDE 5.54 AP% 20.5 Fluid Pack Lot # [id number] LENS:   Implant Name Type Inv. Item Serial No. Manufacturer Lot No. LRB No. Used Action  LENS IOL DIOP 22.5 - [id number] Intraocular Lens LENS IOL DIOP 22.5 [id number] AMO  Left 1 Implanted         SURGEON:  Wyonia Hough, MD   ANESTHESIA:  Topical with tetracaine drops and 2% Xylocaine jelly, augmented with 1% preservative-free intracameral lidocaine.    COMPLICATIONS:  None.   DESCRIPTION OF PROCEDURE:  The patient was identified in the holding room and transported to the operating room and placed in the supine position under the operating microscope.  The left eye was identified as the operative eye and it was prepped and draped in the usual sterile ophthalmic fashion.   A 1 millimeter clear-corneal paracentesis was made at the 1:30 position. 0.5 ml of preservative-free 1% lidocaine was injected into the anterior chamber.  The anterior chamber was filled with Viscoat viscoelastic.  A 2.4 millimeter keratome was used to make a near-clear corneal incision at the 10:30 position.  .  A curvilinear capsulorrhexis was made with a cystotome and capsulorrhexis forceps.  Balanced salt solution was used to hydrodissect and hydrodelineate the nucleus.   Phacoemulsification was then used in stop and chop fashion to remove the lens nucleus and epinucleus.  The remaining cortex was then removed using the irrigation and aspiration handpiece. Provisc was then placed into the capsular bag to distend it for lens placement.  A lens was then  injected into the capsular bag.  The remaining viscoelastic was aspirated.   Wounds were hydrated with balanced salt solution.  The anterior chamber was inflated to a physiologic pressure with balanced salt solution. Vigamox 0.2 ml of a 1mg  per ml solution was injected into the anterior chamber for a dose of 0.2 mg of intracameral antibiotic at the completion of the case.  Miostat was placed into the anterior chamber to constrict the pupil.  No wound leaks were noted.  Topical Vigamox drops and Maxitrol ointment were applied to the eye.  The patient was taken to the recovery room in stable condition without complications of anesthesia or surgery  Zac Torti 10/28/2019, 9:01 AM

## 2019-10-28 NOTE — Anesthesia Postprocedure Evaluation (Signed)
Anesthesia Post Note  Patient: Morgan Lara  Procedure(s) Performed: CATARACT EXTRACTION PHACO AND INTRAOCULAR LENS PLACEMENT ([place]) LEFT (Left Eye)  Patient location during evaluation: PACU Anesthesia Type: MAC Level of consciousness: awake, awake and alert and oriented Pain management: pain level controlled Vital Signs Assessment: post-procedure vital signs reviewed and stable Respiratory status: spontaneous breathing Cardiovascular status: blood pressure returned to baseline Postop Assessment: no headache Anesthetic complications: no     Last Vitals:  Vitals:   10/28/19 0856 10/28/19 0857  BP: 105/67 105/67  Pulse: 78 78  Resp: 16 16  Temp: (!) 36.3 C (!) 36.3 C  SpO2: 97% 97%    Last Pain:  Vitals:   10/28/19 0857  TempSrc:   PainSc: 0-No pain                 Marcia Lepera Tamala Julian

## 2019-10-28 NOTE — Anesthesia Post-op Follow-up Note (Signed)
Anesthesia QCDR form completed.        

## 2019-10-31 ENCOUNTER — Encounter: Payer: Self-pay | Admitting: Ophthalmology

## 2020-03-06 ENCOUNTER — Encounter: Payer: Self-pay | Admitting: Ophthalmology

## 2020-03-07 ENCOUNTER — Other Ambulatory Visit: Payer: Self-pay

## 2020-03-07 ENCOUNTER — Other Ambulatory Visit
Admission: RE | Admit: 2020-03-07 | Discharge: 2020-03-07 | Disposition: A | Discharge status: Home / Self Care | Payer: Medicare Other | Source: Ambulatory Visit | Attending: Ophthalmology | Admitting: Ophthalmology

## 2020-03-07 DIAGNOSIS — Z01812 Encounter for preprocedural laboratory examination: Secondary | ICD-10-CM | POA: Insufficient documentation

## 2020-03-07 DIAGNOSIS — Z20822 Contact with and (suspected) exposure to covid-19: Secondary | ICD-10-CM | POA: Diagnosis not present

## 2020-03-07 LAB — SARS CORONAVIRUS 2 (TAT 6-24 HRS): SARS Coronavirus 2: NEGATIVE

## 2020-03-09 ENCOUNTER — Ambulatory Visit: Payer: Medicare Other | Admitting: Anesthesiology

## 2020-03-09 ENCOUNTER — Ambulatory Visit
Admission: RE | Admit: 2020-03-09 | Discharge: 2020-03-09 | Disposition: A | Discharge status: Home / Self Care | Payer: Medicare Other | Attending: Ophthalmology | Admitting: Ophthalmology

## 2020-03-09 ENCOUNTER — Encounter
Admission: RE | Disposition: A | Discharge status: Home / Self Care | Payer: Self-pay | Source: Home / Self Care | Attending: Ophthalmology

## 2020-03-09 ENCOUNTER — Encounter: Payer: Self-pay | Admitting: Ophthalmology

## 2020-03-09 ENCOUNTER — Other Ambulatory Visit: Payer: Self-pay

## 2020-03-09 DIAGNOSIS — Z8673 Personal history of transient ischemic attack (TIA), and cerebral infarction without residual deficits: Secondary | ICD-10-CM | POA: Insufficient documentation

## 2020-03-09 DIAGNOSIS — H2511 Age-related nuclear cataract, right eye: Secondary | ICD-10-CM | POA: Insufficient documentation

## 2020-03-09 DIAGNOSIS — I10 Essential (primary) hypertension: Secondary | ICD-10-CM | POA: Diagnosis not present

## 2020-03-09 DIAGNOSIS — Z87891 Personal history of nicotine dependence: Secondary | ICD-10-CM | POA: Insufficient documentation

## 2020-03-09 HISTORY — DX: Anxiety disorder, unspecified: F41.9

## 2020-03-09 HISTORY — PX: CATARACT EXTRACTION W/PHACO: SHX586

## 2020-03-09 SURGERY — PHACOEMULSIFICATION, CATARACT, WITH IOL INSERTION
Anesthesia: Monitor Anesthesia Care | Site: Eye | Laterality: Right

## 2020-03-09 MED ORDER — PROPOFOL 10 MG/ML IV BOLUS
INTRAVENOUS | Status: AC
  Filled 2020-03-09: qty 20

## 2020-03-09 MED ORDER — FENTANYL CITRATE (PF) 100 MCG/2ML IJ SOLN
INTRAMUSCULAR | Status: AC
  Filled 2020-03-09: qty 2

## 2020-03-09 MED ORDER — EPINEPHRINE PF 1 MG/ML IJ SOLN
INTRAOCULAR | Status: DC | PRN
  Administered 2020-03-09: 08:00:00 200 mL via OPHTHALMIC

## 2020-03-09 MED ORDER — EPINEPHRINE PF 1 MG/ML IJ SOLN
INTRAMUSCULAR | Status: AC
  Filled 2020-03-09: qty 2

## 2020-03-09 MED ORDER — LIDOCAINE HCL (PF) 4 % IJ SOLN
INTRAMUSCULAR | Status: AC
  Filled 2020-03-09: qty 5

## 2020-03-09 MED ORDER — TETRACAINE HCL 0.5 % OP SOLN
OPHTHALMIC | Status: AC
  Filled 2020-03-09: qty 4

## 2020-03-09 MED ORDER — TETRACAINE HCL 0.5 % OP SOLN
1.0000 [drp] | Freq: Once | OPHTHALMIC | Status: AC
  Administered 2020-03-09 (×2): 1 [drp] via OPHTHALMIC

## 2020-03-09 MED ORDER — MOXIFLOXACIN HCL 0.5 % OP SOLN
1.0000 [drp] | OPHTHALMIC | Status: AC
  Administered 2020-03-09 (×2): 1 [drp] via OPHTHALMIC

## 2020-03-09 MED ORDER — ARMC OPHTHALMIC DILATING DROPS
1.0000 "application " | OPHTHALMIC | Status: AC
  Administered 2020-03-09 (×2): 1 via OPHTHALMIC

## 2020-03-09 MED ORDER — NEOMYCIN-POLYMYXIN-DEXAMETH 3.5-10000-0.1 OP OINT
TOPICAL_OINTMENT | OPHTHALMIC | Status: AC
  Filled 2020-03-09: qty 3.5

## 2020-03-09 MED ORDER — SODIUM CHLORIDE 0.9 % IV SOLN: INTRAVENOUS | Status: DC

## 2020-03-09 MED ORDER — MIDAZOLAM HCL 2 MG/2ML IJ SOLN
INTRAMUSCULAR | Status: DC | PRN
  Administered 2020-03-09 (×2): 1 mg via INTRAVENOUS

## 2020-03-09 MED ORDER — POVIDONE-IODINE 5 % OP SOLN
OPHTHALMIC | Status: AC
  Filled 2020-03-09: qty 30

## 2020-03-09 MED ORDER — MIDAZOLAM HCL 2 MG/2ML IJ SOLN
INTRAMUSCULAR | Status: AC
  Filled 2020-03-09: qty 2

## 2020-03-09 MED ORDER — NA CHONDROIT SULF-NA HYALURON 40-30 MG/ML IO SOLN
INTRAOCULAR | Status: DC | PRN
  Administered 2020-03-09: 0.5 mL via INTRAOCULAR

## 2020-03-09 MED ORDER — FENTANYL CITRATE (PF) 100 MCG/2ML IJ SOLN
INTRAMUSCULAR | Status: DC | PRN
  Administered 2020-03-09: 50 ug via INTRAVENOUS

## 2020-03-09 MED ORDER — MOXIFLOXACIN HCL 0.5 % OP SOLN
OPHTHALMIC | Status: AC
  Administered 2020-03-09: 1 [drp] via OPHTHALMIC
  Filled 2020-03-09: qty 6

## 2020-03-09 MED ORDER — ARMC OPHTHALMIC DILATING DROPS
OPHTHALMIC | Status: AC
  Administered 2020-03-09: 1 via OPHTHALMIC
  Filled 2020-03-09: qty 0.5

## 2020-03-09 MED ORDER — POVIDONE-IODINE 5 % OP SOLN
OPHTHALMIC | Status: DC | PRN
  Administered 2020-03-09: 1 via OPHTHALMIC

## 2020-03-09 MED ORDER — PROVISC 10 MG/ML IO SOLN
INTRAOCULAR | Status: DC | PRN
  Administered 2020-03-09: 0.55 mL via INTRAOCULAR

## 2020-03-09 MED ORDER — LIDOCAINE HCL (PF) 4 % IJ SOLN
INTRAOCULAR | Status: DC | PRN
  Administered 2020-03-09: 2.5 mL via OPHTHALMIC

## 2020-03-09 MED ORDER — NEOMYCIN-POLYMYXIN-DEXAMETH 0.1 % OP OINT
TOPICAL_OINTMENT | OPHTHALMIC | Status: DC | PRN
  Administered 2020-03-09: 1 via OPHTHALMIC

## 2020-03-09 SURGICAL SUPPLY — 18 items
GLOVE BIO SURGEON STRL SZ8 (GLOVE) ×3 IMPLANT
GLOVE BIOGEL M 6.5 STRL (GLOVE) ×3 IMPLANT
GLOVE SURG LX 7.5 STRW (GLOVE) ×2
GLOVE SURG LX STRL 7.5 STRW (GLOVE) ×1 IMPLANT
GOWN STRL REUS W/ TWL LRG LVL3 (GOWN DISPOSABLE) ×2 IMPLANT
GOWN STRL REUS W/TWL LRG LVL3 (GOWN DISPOSABLE) ×4
LABEL CATARACT MEDS ST (LABEL) ×3 IMPLANT
LENS IOL TECNIS ITEC 21.5 (Intraocular Lens) ×2 IMPLANT
NDL HPO THNWL 1X22GA REG BVL (NEEDLE) ×1 IMPLANT
NEEDLE SAFETY 22GX1 (NEEDLE) ×2
PACK CATARACT (MISCELLANEOUS) ×3 IMPLANT
PACK CATARACT BRASINGTON LX (MISCELLANEOUS) ×3 IMPLANT
PACK EYE AFTER SURG (MISCELLANEOUS) ×3 IMPLANT
SOL BSS BAG (MISCELLANEOUS) ×3
SOLUTION BSS BAG (MISCELLANEOUS) ×1 IMPLANT
SYR 5ML LL (SYRINGE) ×3 IMPLANT
WATER STERILE IRR 250ML POUR (IV SOLUTION) ×3 IMPLANT
WIPE NON LINTING 3.25X3.25 (MISCELLANEOUS) ×3 IMPLANT

## 2020-03-09 NOTE — Transfer of Care (Signed)
Immediate Anesthesia Transfer of Care Note  Patient: Morgan Lara  Procedure(s) Performed: CATARACT EXTRACTION PHACO AND INTRAOCULAR LENS PLACEMENT (IOC) RIGHT (Right Eye)  Patient Location: PACU  Anesthesia Type:MAC  Level of Consciousness: awake  Airway & Oxygen Therapy: Patient Spontanous Breathing  Post-op Assessment: Report given to RN  Post vital signs: stable  Last Vitals:  Vitals Value Taken Time  BP    Temp    Pulse    Resp    SpO2      Last Pain:  Vitals:   03/09/20 0628  TempSrc: Temporal  PainSc: 0-No pain         Complications: No apparent anesthesia complications

## 2020-03-09 NOTE — Op Note (Signed)
OPERATIVE NOTE  Evalene Howcroft [id number] 03/09/2020   PREOPERATIVE DIAGNOSIS:  Nuclear Sclerotic Cataract Right Eye H25.11   POSTOPERATIVE DIAGNOSIS: Nuclear Sclerotic Cataract Right Eye H25.11          PROCEDURE:  Phacoemusification with posterior chamber intraocular lens placement of the right eye  Procedure(s) with comments: CATARACT EXTRACTION PHACO AND INTRAOCULAR LENS PLACEMENT (IOC) RIGHT (Right) - [place]: 00:55.3 CDE: 5.48   LENS:   Implant Name Type Inv. Item Serial No. Manufacturer Lot No. LRB No. Used Action  LENS IOL DIOP 21.5 - [id number] Intraocular Lens LENS IOL DIOP 21.5 [id number] AMO  Right 1 Implanted        SURGEON:  Wyonia Hough, MD   ANESTHESIA:  Topical with tetracaine drops and 2% Xylocaine jelly, augmented with 1% preservative-free intracameral lidocaine.    COMPLICATIONS:  None.   DESCRIPTION OF PROCEDURE:  The patient was identified in the holding room and transported to the operating room and placed in the supine position under the operating microscope. Theright eye was identified as the operative eye and it was prepped and draped in the usual sterile ophthalmic fashion.   A 1 millimeter clear-corneal paracentesis was made at the 12:00 position.  0.5 ml of preservative-free 1% lidocaine was injected into the anterior chamber. The anterior chamber was filled with Viscoat viscoelastic.  A 2.4 millimeter keratome was used to make a near-clear corneal incision at the 9:00 position. A curvilinear capsulorrhexis was made with a cystotome and capsulorrhexis forceps.  Balanced salt solution was used to hydrodissect and hydrodelineate the nucleus.   Phacoemulsification was then used in stop and chop fashion to remove the lens nucleus and epinucleus.  The remaining cortex was then removed using the irrigation and aspiration handpiece. Provisc was then placed into the capsular bag to distend it for lens placement.  A lens was then injected into the  capsular bag.  The remaining viscoelastic was aspirated.  Wounds were hydrated with balanced salt solution.  The anterior chamber was inflated to a physiologic pressure with balanced salt solution. Vigamox 0.2 ml of a 1mg  per ml solution was injected into the anterior chamber for a dose of 0.2 mg of intracameral antibiotic at the completion of the case. Miostat was placed into the anterior chamber to constrict the pupil.  No wound leaks were noted.  Topical Vigamox drops and Maxitrol ointment were applied to the eye.  The patient was taken to the recovery room in stable condition without complications of anesthesia or surgery.  Isobel Eisenhuth 03/09/2020, 8:06 AM

## 2020-03-09 NOTE — Discharge Instructions (Addendum)
Eye Surgery Discharge Instructions  Expect mild scratchy sensation or mild soreness. DO NOT RUB YOUR EYE!  The day of surgery: . Minimal physical activity, but bed rest is not required . No reading, computer work, or close hand work . No bending, lifting, or straining. . May watch TV  For 24 hours: . No driving, legal decisions, or alcoholic beverages . Safety precautions . Eat anything you prefer: It is better to start with liquids, then soup then solid foods. . Solar shield eyeglasses should be worn for comfort in the sunlight/patch while sleeping  Resume all regular medications including aspirin or Coumadin if these were discontinued prior to surgery. You may shower, bathe, shave, or wash your hair. Tylenol may be taken for mild discomfort. Follow eye drop instruction sheet as reviewed.\  Call your doctor if you experience significant pain, nausea, or vomiting, fever > 101 or other signs of infection. [phone] or [phone] Specific instructions:  Follow-up Information    Birder Robson, MD Follow up.   Specialty: Ophthalmology Why: 03/09/20 @  Contact information: [place] [address] [phone]

## 2020-03-09 NOTE — H&P (Signed)

## 2020-03-09 NOTE — Anesthesia Postprocedure Evaluation (Signed)
Anesthesia Post Note  Patient: Morgan Lara  Procedure(s) Performed: CATARACT EXTRACTION PHACO AND INTRAOCULAR LENS PLACEMENT (IOC) RIGHT (Right Eye)  Patient location during evaluation: PACU Anesthesia Type: MAC Level of consciousness: awake and alert and oriented Pain management: pain level controlled Vital Signs Assessment: post-procedure vital signs reviewed and stable Respiratory status: spontaneous breathing, nonlabored ventilation and respiratory function stable Cardiovascular status: blood pressure returned to baseline and stable Postop Assessment: no signs of nausea or vomiting Anesthetic complications: no     Last Vitals:  Vitals:   03/09/20 0628 03/09/20 0805  BP: 125/69 (!) 113/58  Pulse: 98 71  Resp: 14 16  Temp: 36.9 C (!) 36.2 C  SpO2: 99% 98%    Last Pain:  Vitals:   03/09/20 0805  TempSrc: Temporal  PainSc: 0-No pain                 Troyce Gieske

## 2020-03-09 NOTE — Anesthesia Preprocedure Evaluation (Signed)
Anesthesia Evaluation  Patient identified by MRN, date of birth, ID band Patient awake    Reviewed: Allergy & Precautions, NPO status , Patient's Chart, lab work & pertinent test results  History of Anesthesia Complications Negative for: history of anesthetic complications  Airway Mallampati: II  TM Distance: >3 FB Neck ROM: Full    Dental  (+) Upper Dentures, Lower Dentures   Pulmonary neg sleep apnea, neg COPD, former smoker,    breath sounds clear to auscultation- rhonchi (-) wheezing      Cardiovascular hypertension, Pt. on medications (-) CAD, (-) Past MI, (-) Cardiac Stents and (-) CABG  Rhythm:Regular Rate:Normal - Systolic murmurs and - Diastolic murmurs    Neuro/Psych PSYCHIATRIC DISORDERS Anxiety CVA (remote), No Residual Symptoms    GI/Hepatic negative GI ROS, Neg liver ROS,   Endo/Other  negative endocrine ROSneg diabetes  Renal/GU negative Renal ROS     Musculoskeletal negative musculoskeletal ROS (+)   Abdominal (+) - obese,   Peds  Hematology negative hematology ROS (+)   Anesthesia Other Findings Past Medical History: No date: Allergic rhinitis No date: Anxiety No date: Cancer [facility])     Comment:  skin No date: Cataract     Comment:  left No date: Hair loss No date: Hematuria No date: HOH (hard of hearing) No date: Hyperlipidemia No date: Hypertension No date: Microscopic hematuria No date: Pneumonia No date: Stroke [facility])     Comment:  ? of TIA No date: Tachycardia, unspecified   Reproductive/Obstetrics                             Anesthesia Physical Anesthesia Plan  ASA: II  Anesthesia Plan: MAC   Post-op Pain Management:    Induction: Intravenous  PONV Risk Score and Plan: 2 and Midazolam  Airway Management Planned: Natural Airway  Additional Equipment:   Intra-op Plan:   Post-operative Plan:   Informed Consent: I have reviewed the patients  History and Physical, chart, labs and discussed the procedure including the risks, benefits and alternatives for the proposed anesthesia with the patient or authorized representative who has indicated his/her understanding and acceptance.       Plan Discussed with: CRNA and Anesthesiologist  Anesthesia Plan Comments:         Anesthesia Quick Evaluation

## 2020-06-24 SURGERY — PHACOEMULSIFICATION, CATARACT, WITH IOL INSERTION
Anesthesia: Choice | Laterality: Left

## 2021-01-17 ENCOUNTER — Other Ambulatory Visit: Payer: Medicare Other

## 2024-01-15 ENCOUNTER — Ambulatory Visit: Payer: Self-pay | Admitting: Internal Medicine

## 2024-01-15 NOTE — Telephone Encounter (Signed)
Spoke with pt states that she does not need any medication at this time, nor any concerns.  Copied from CRM [id number]. Topic: Clinical - Medication Question >> Jan 15, 2024  3:05 PM Alvino Blood C wrote: Reason for CRM: Patient has questions about the following medication: digoxin and would like a call back at [phone]
